# Patient Record
Sex: Female | Born: 1975 | Race: White | Hispanic: No | Marital: Married | Smoking: Never smoker
Health system: Southern US, Community
[De-identification: ages and names within clinical notes are randomized; demographics above are authoritative.]

## PROBLEM LIST (undated history)

## (undated) HISTORY — PX: DILATION AND CURETTAGE OF UTERUS: SHX78

---

## 2005-07-25 ENCOUNTER — Other Ambulatory Visit: Admission: RE | Admit: 2005-07-25 | Discharge: 2005-07-25 | Payer: Self-pay | Admitting: Gynecology

## 2006-07-27 ENCOUNTER — Other Ambulatory Visit: Admission: RE | Admit: 2006-07-27 | Discharge: 2006-07-27 | Payer: Self-pay | Admitting: Gynecology

## 2007-01-04 ENCOUNTER — Ambulatory Visit (HOSPITAL_COMMUNITY): Admission: AD | Admit: 2007-01-04 | Discharge: 2007-01-04 | Payer: Self-pay | Admitting: Obstetrics and Gynecology

## 2007-01-04 ENCOUNTER — Encounter (INDEPENDENT_AMBULATORY_CARE_PROVIDER_SITE_OTHER): Payer: Self-pay | Admitting: Specialist

## 2007-11-15 ENCOUNTER — Inpatient Hospital Stay (HOSPITAL_COMMUNITY): Admission: AD | Admit: 2007-11-15 | Discharge: 2007-11-17 | Payer: Self-pay | Admitting: Radiology

## 2011-03-22 ENCOUNTER — Other Ambulatory Visit: Payer: Self-pay | Admitting: Obstetrics and Gynecology

## 2011-03-24 NOTE — Op Note (Signed)
NAMENYSA, SARIN             ACCOUNT NO.:  [id number]   MEDICAL RECORD NO.:  [id number]          PATIENT TYPE:  AMB   LOCATION:  [facility]                          FACILITY:  [facility]   PHYSICIAN:  Malva Limes, M.D.    DATE OF BIRTH:  [birth year]   DATE OF PROCEDURE:  01/04/2007  DATE OF DISCHARGE:                               OPERATIVE REPORT   PREOPERATIVE DIAGNOSIS:  Incomplete abortion.   POSTOPERATIVE DIAGNOSES:  Incomplete abortion.   PROCEDURE:  Dilation, evacuation.   SURGEON:  Malva Limes, M.D.   ANESTHESIA:  Monitored anesthesia care.   ANTIBIOTICS:  Ancef 1 gram.   DRAINS:  None.   SPECIMENS:  Products of conception sent to pathology.   COMPLICATIONS:  None.   ESTIMATED BLOOD LOSS:  70 mL.   PROCEDURE:  The patient was taken to the operating room where she was  placed in the dorsal supine position.  MAC anesthesia was administered  without complication.  She was then placed in dorsal lithotomy position.  She was prepped with Betadine and draped in the usual fashion for this  procedure.  The patient had an examine under anesthesia which revealed a  10-week size uterus, being retroverted.  There was a minimal amount of  bleeding going on.  Sterile speculum was placed in the vagina.  A single-  tooth tenaculum was applied to the anterior cervical lip.  The cervix  was serially dilated to a 31-French.  An 8 mm suction cannula was placed  into the uterine cavity and products of conception were withdrawn.  Sharp curettage was then performed followed by repeat suction.  The  patient tolerated the procedure well.  She was taken to recovery room in  stable condition.  Instrument and lap counts were correct times one.  The patient's blood type is Rh positive and therefore no RhoGAM is  indicated.  The patient will be discharged to home.  She will be sent  home with Percocet to take p.r.n.  She will follow up in the office in  two weeks.     ______________________________  Malva Limes, M.D.     MA/MEDQ  D:  01/04/2007  T:  01/04/2007  Job:  [id number]

## 2011-07-27 LAB — CBC
HCT: 25 — ABNORMAL LOW
HCT: 31.1 — ABNORMAL LOW
Hemoglobin: 11 — ABNORMAL LOW
Hemoglobin: 7.4 — CL
MCHC: 35
MCHC: 35.5
MCHC: 35.5
MCV: 92.5
MCV: 92.9
Platelets: 138 — ABNORMAL LOW
Platelets: 147 — ABNORMAL LOW
Platelets: 149 — ABNORMAL LOW
RBC: 2.28 — ABNORMAL LOW
RDW: 13.1
RDW: 13.2
RDW: 13.6
WBC: 13.7 — ABNORMAL HIGH
WBC: 18.9 — ABNORMAL HIGH

## 2011-10-10 ENCOUNTER — Other Ambulatory Visit: Payer: Self-pay | Admitting: Obstetrics and Gynecology

## 2011-11-07 NOTE — L&D Delivery Note (Signed)
Delivery Note At 5:52 PM a viable and healthy female was delivered via Vaginal, Spontaneous Delivery (Presentation: Left Occiput Anterior).  APGAR: 8, 9; weight 8 lb 15.7 oz (4074 g).   Placenta status: Intact, Spontaneous.  Cord: 3 vessels   Anesthesia: Epidural  Episiotomy: None Lacerations: 2nd degree Suture Repair: 3.0 vicryl Est. Blood Loss (mL): 250  Mom to postpartum.  Baby to nursery-stable.  Quintavia Rogstad H. 05/31/2012, 7:29 PM

## 2011-11-08 LAB — OB RESULTS CONSOLE RPR: RPR: NONREACTIVE

## 2011-11-08 LAB — OB RESULTS CONSOLE ANTIBODY SCREEN: Antibody Screen: NEGATIVE

## 2011-11-08 LAB — OB RESULTS CONSOLE HEPATITIS B SURFACE ANTIGEN: Hepatitis B Surface Ag: NEGATIVE

## 2011-11-08 LAB — OB RESULTS CONSOLE ABO/RH: RH Type: POSITIVE

## 2012-04-30 LAB — OB RESULTS CONSOLE GBS: GBS: NEGATIVE

## 2012-05-31 ENCOUNTER — Encounter (HOSPITAL_COMMUNITY): Payer: Self-pay | Admitting: *Deleted

## 2012-05-31 ENCOUNTER — Inpatient Hospital Stay (HOSPITAL_COMMUNITY): Payer: 59 | Admitting: Anesthesiology

## 2012-05-31 ENCOUNTER — Encounter (HOSPITAL_COMMUNITY): Payer: Self-pay | Admitting: Anesthesiology

## 2012-05-31 ENCOUNTER — Inpatient Hospital Stay (HOSPITAL_COMMUNITY)
Admission: AD | Admit: 2012-05-31 | Discharge: 2012-06-02 | DRG: 775 | Disposition: A | Discharge status: Home / Self Care | Payer: 59 | Source: Ambulatory Visit | Attending: Obstetrics and Gynecology | Admitting: Obstetrics and Gynecology

## 2012-05-31 DIAGNOSIS — O480 Post-term pregnancy: Principal | ICD-10-CM | POA: Diagnosis present

## 2012-05-31 LAB — CBC
HCT: 33 % — ABNORMAL LOW (ref 36.0–46.0)
Hemoglobin: 11 g/dL — ABNORMAL LOW (ref 12.0–15.0)
MCHC: 33.3 g/dL (ref 30.0–36.0)
RBC: 3.63 MIL/uL — ABNORMAL LOW (ref 3.87–5.11)
WBC: 8 10*3/uL (ref 4.0–10.5)

## 2012-05-31 LAB — TYPE AND SCREEN
ABO/RH(D): A POS
Antibody Screen: NEGATIVE

## 2012-05-31 LAB — ABO/RH: ABO/RH(D): A POS

## 2012-05-31 MED ORDER — LIDOCAINE HCL (PF) 1 % IJ SOLN
30.0000 mL | INTRAMUSCULAR | Status: DC | PRN
  Filled 2012-05-31: qty 30

## 2012-05-31 MED ORDER — ONDANSETRON HCL 4 MG/2ML IJ SOLN: 4.0000 mg | INTRAMUSCULAR | Status: DC | PRN

## 2012-05-31 MED ORDER — BUTORPHANOL TARTRATE 1 MG/ML IJ SOLN: 1.0000 mg | INTRAMUSCULAR | Status: DC | PRN

## 2012-05-31 MED ORDER — LACTATED RINGERS IV SOLN
500.0000 mL | Freq: Once | INTRAVENOUS | Status: AC
  Administered 2012-05-31: 1000 mL via INTRAVENOUS

## 2012-05-31 MED ORDER — WITCH HAZEL-GLYCERIN EX PADS: 1.0000 "application " | MEDICATED_PAD | CUTANEOUS | Status: DC | PRN

## 2012-05-31 MED ORDER — TETANUS-DIPHTH-ACELL PERTUSSIS 5-2.5-18.5 LF-MCG/0.5 IM SUSP
0.5000 mL | Freq: Once | INTRAMUSCULAR | Status: AC
  Administered 2012-06-01: 0.5 mL via INTRAMUSCULAR
  Filled 2012-05-31: qty 0.5

## 2012-05-31 MED ORDER — ONDANSETRON HCL 4 MG/2ML IJ SOLN: 4.0000 mg | Freq: Four times a day (QID) | INTRAMUSCULAR | Status: DC | PRN

## 2012-05-31 MED ORDER — PRENATAL MULTIVITAMIN CH
1.0000 | ORAL_TABLET | Freq: Every day | ORAL | Status: DC
  Administered 2012-05-31 – 2012-06-02 (×3): 1 via ORAL
  Filled 2012-05-31 (×3): qty 1

## 2012-05-31 MED ORDER — FLEET ENEMA 7-19 GM/118ML RE ENEM: 1.0000 | ENEMA | RECTAL | Status: DC | PRN

## 2012-05-31 MED ORDER — TERBUTALINE SULFATE 1 MG/ML IJ SOLN: 0.2500 mg | Freq: Once | INTRAMUSCULAR | Status: DC | PRN

## 2012-05-31 MED ORDER — SIMETHICONE 80 MG PO CHEW: 80.0000 mg | CHEWABLE_TABLET | ORAL | Status: DC | PRN

## 2012-05-31 MED ORDER — ACETAMINOPHEN 325 MG PO TABS: 650.0000 mg | ORAL_TABLET | ORAL | Status: DC | PRN

## 2012-05-31 MED ORDER — PHENYLEPHRINE 40 MCG/ML (10ML) SYRINGE FOR IV PUSH (FOR BLOOD PRESSURE SUPPORT)
80.0000 ug | PREFILLED_SYRINGE | INTRAVENOUS | Status: DC | PRN
  Filled 2012-05-31: qty 5

## 2012-05-31 MED ORDER — EPHEDRINE 5 MG/ML INJ: 10.0000 mg | INTRAVENOUS | Status: DC | PRN

## 2012-05-31 MED ORDER — CITRIC ACID-SODIUM CITRATE 334-500 MG/5ML PO SOLN: 30.0000 mL | ORAL | Status: DC | PRN

## 2012-05-31 MED ORDER — FENTANYL 2.5 MCG/ML BUPIVACAINE 1/10 % EPIDURAL INFUSION (WH - ANES)
14.0000 mL/h | INTRAMUSCULAR | Status: DC
  Administered 2012-05-31 (×2): 14 mL/h via EPIDURAL
  Filled 2012-05-31 (×2): qty 60

## 2012-05-31 MED ORDER — IBUPROFEN 600 MG PO TABS
600.0000 mg | ORAL_TABLET | Freq: Four times a day (QID) | ORAL | Status: DC
  Administered 2012-05-31 – 2012-06-02 (×6): 600 mg via ORAL
  Filled 2012-05-31 (×6): qty 1

## 2012-05-31 MED ORDER — OXYCODONE-ACETAMINOPHEN 5-325 MG PO TABS: 1.0000 | ORAL_TABLET | ORAL | Status: DC | PRN

## 2012-05-31 MED ORDER — DIBUCAINE 1 % RE OINT: 1.0000 "application " | TOPICAL_OINTMENT | RECTAL | Status: DC | PRN

## 2012-05-31 MED ORDER — MISOPROSTOL 200 MCG PO TABS
800.0000 ug | ORAL_TABLET | Freq: Once | ORAL | Status: AC
  Administered 2012-05-31: 800 ug via RECTAL
  Filled 2012-05-31: qty 4

## 2012-05-31 MED ORDER — EPHEDRINE 5 MG/ML INJ
10.0000 mg | INTRAVENOUS | Status: DC | PRN
  Filled 2012-05-31: qty 4

## 2012-05-31 MED ORDER — ZOLPIDEM TARTRATE 5 MG PO TABS: 5.0000 mg | ORAL_TABLET | Freq: Every evening | ORAL | Status: DC | PRN

## 2012-05-31 MED ORDER — SENNOSIDES-DOCUSATE SODIUM 8.6-50 MG PO TABS
2.0000 | ORAL_TABLET | Freq: Every day | ORAL | Status: DC
  Administered 2012-05-31 – 2012-06-01 (×2): 2 via ORAL

## 2012-05-31 MED ORDER — LIDOCAINE HCL (PF) 1 % IJ SOLN
INTRAMUSCULAR | Status: DC | PRN
  Administered 2012-05-31 (×3): 4 mL

## 2012-05-31 MED ORDER — LACTATED RINGERS IV SOLN: 500.0000 mL | INTRAVENOUS | Status: DC | PRN

## 2012-05-31 MED ORDER — DIPHENHYDRAMINE HCL 25 MG PO CAPS: 25.0000 mg | ORAL_CAPSULE | Freq: Four times a day (QID) | ORAL | Status: DC | PRN

## 2012-05-31 MED ORDER — ONDANSETRON HCL 4 MG PO TABS: 4.0000 mg | ORAL_TABLET | ORAL | Status: DC | PRN

## 2012-05-31 MED ORDER — OXYTOCIN BOLUS FROM INFUSION
250.0000 mL | Freq: Once | INTRAVENOUS | Status: DC
  Filled 2012-05-31: qty 500

## 2012-05-31 MED ORDER — LACTATED RINGERS IV SOLN
INTRAVENOUS | Status: DC
  Administered 2012-05-31: 125 mL/h via INTRAVENOUS

## 2012-05-31 MED ORDER — OXYTOCIN 40 UNITS IN LACTATED RINGERS INFUSION - SIMPLE MED: 62.5000 mL/h | Freq: Once | INTRAVENOUS | Status: DC

## 2012-05-31 MED ORDER — IBUPROFEN 600 MG PO TABS: 600.0000 mg | ORAL_TABLET | Freq: Four times a day (QID) | ORAL | Status: DC | PRN

## 2012-05-31 MED ORDER — PHENYLEPHRINE 40 MCG/ML (10ML) SYRINGE FOR IV PUSH (FOR BLOOD PRESSURE SUPPORT): 80.0000 ug | PREFILLED_SYRINGE | INTRAVENOUS | Status: DC | PRN

## 2012-05-31 MED ORDER — BENZOCAINE-MENTHOL 20-0.5 % EX AERO
1.0000 "application " | INHALATION_SPRAY | CUTANEOUS | Status: DC | PRN
  Administered 2012-06-01: 1 via TOPICAL
  Filled 2012-05-31 (×2): qty 56

## 2012-05-31 MED ORDER — LANOLIN HYDROUS EX OINT: TOPICAL_OINTMENT | CUTANEOUS | Status: DC | PRN

## 2012-05-31 MED ORDER — DIPHENHYDRAMINE HCL 50 MG/ML IJ SOLN: 12.5000 mg | INTRAMUSCULAR | Status: DC | PRN

## 2012-05-31 MED ORDER — OXYTOCIN 40 UNITS IN LACTATED RINGERS INFUSION - SIMPLE MED
1.0000 m[IU]/min | INTRAVENOUS | Status: DC
  Administered 2012-05-31: 2 m[IU]/min via INTRAVENOUS
  Filled 2012-05-31: qty 1000

## 2012-05-31 NOTE — Anesthesia Procedure Notes (Signed)
Epidural Patient location during procedure: OB Start time: 05/31/2012 12:06 PM Reason for block: procedure for pain  Staffing Performed by: anesthesiologist   Preanesthetic Checklist Completed: patient identified, site marked, surgical consent, pre-op evaluation, timeout performed, IV checked, risks and benefits discussed and monitors and equipment checked  Epidural Patient position: sitting Prep: site prepped and draped and DuraPrep Patient monitoring: continuous pulse ox and blood pressure Approach: midline Injection technique: LOR air  Needle:  Needle type: Tuohy  Needle gauge: 17 G Needle length: 9 cm Needle insertion depth: 6 cm Catheter type: closed end flexible Catheter size: 19 Gauge Catheter at skin depth: 11 cm Test dose: negative  Assessment Events: blood not aspirated, injection not painful, no injection resistance, negative IV test and no paresthesia  Additional Notes Discussed risk of headache, infection, bleeding, nerve injury and failed or incomplete block.  Patient voices understanding and wishes to proceed.

## 2012-05-31 NOTE — Progress Notes (Signed)
S: Comfortable, feeling occassional tightening O: AFVSS cvx 4/70/-2 FHT 140 reactive toco Q 2-3  AROM copious clear fluid  A/P  1) Cont pit 2) Will get epidural

## 2012-05-31 NOTE — H&P (Signed)
Amy Horne is a 36 y.o. female presenting for IOL for postdates Pt has had an uncomplicated pregnancy up to this point.  She had a posterior complete previa but this was resolved on ultrasound at 28 weeks.  She presents today for a postdates induction of labor History OB History    Grav Para Term Preterm Abortions TAB SAB Ect Mult Living   3 1 1  0 1 0 1 0 0 1     History reviewed. No pertinent past medical history. Past Surgical History  Procedure Date  . Dilation and curettage of uterus    Family History: family history is not on file. Social History:  reports that she has never smoked. She does not have any smokeless tobacco history on file. She reports that she does not drink alcohol or use illicit drugs.   Prenatal Transfer Tool  Maternal Diabetes: No Genetic Screening: Normal Maternal Ultrasounds/Referrals: Normal Fetal Ultrasounds or other Referrals:  None Maternal Substance Abuse:  No Significant Maternal Medications:  None Significant Maternal Lab Results:  None Other Comments:  None  ROSas above  Dilation: 4 Effacement (%): 60 Station: -2 Exam by:: Dr. Tenny Craw Blood pressure 127/70, pulse 77, temperature 98.1 F (36.7 C), temperature source Oral, resp. rate 18, height 5\' 8"  (1.727 m), weight 87.998 kg (194 lb), SpO2 100.00%. Exam Physical Exam  Prenatal labs: ABO, Rh: --/--/A POS, A POS (07/26 1610) Antibody: NEG (07/26 9604) Rubella: Immune (01/02 0000) RPR: Nonreactive (01/02 0000)  HBsAg: Negative (01/02 0000)  HIV: Non-reactive (01/02 0000)  GBS: Negative (06/25 0000)   Assessment/Plan: Admit for IOL Pitocin, AROM when able Epidural on request  Amy Ditter H. 05/31/2012, 12:58 PM

## 2012-05-31 NOTE — Anesthesia Preprocedure Evaluation (Signed)

## 2012-06-01 LAB — RPR: RPR Ser Ql: NONREACTIVE

## 2012-06-01 LAB — CBC
MCH: 30.8 pg (ref 26.0–34.0)
MCV: 90.9 fL (ref 78.0–100.0)
Platelets: 120 10*3/uL — ABNORMAL LOW (ref 150–400)
RBC: 3.08 MIL/uL — ABNORMAL LOW (ref 3.87–5.11)

## 2012-06-01 NOTE — Anesthesia Postprocedure Evaluation (Signed)
  Anesthesia Post-op Note  Patient: Amy Horne  Procedure(s) Performed: * No procedures listed *  Patient Location: Mother/Baby  Anesthesia Type: Epidural  Level of Consciousness: awake, alert  and oriented  Airway and Oxygen Therapy: Patient Spontanous Breathing  Post-op Pain: mild  Post-op Assessment: Patient's Cardiovascular Status Stable, Respiratory Function Stable, Patent Airway, No signs of Nausea or vomiting and Pain level controlled  Post-op Vital Signs: stable  Complications: No apparent anesthesia complications

## 2012-06-02 MED ORDER — DOCUSATE SODIUM 100 MG PO CAPS: 100.0000 mg | ORAL_CAPSULE | Freq: Two times a day (BID) | ORAL | Status: AC

## 2012-06-02 MED ORDER — HYDROCODONE-ACETAMINOPHEN 5-500 MG PO TABS: 1.0000 | ORAL_TABLET | ORAL | Status: AC | PRN

## 2012-06-02 MED ORDER — IBUPROFEN 600 MG PO TABS: 600.0000 mg | ORAL_TABLET | Freq: Four times a day (QID) | ORAL | Status: AC | PRN

## 2012-06-02 NOTE — Discharge Summary (Signed)
Obstetric Discharge Summary Reason for Admission: induction of labor Prenatal Procedures: NST and ultrasound Intrapartum Procedures: spontaneous vaginal delivery Postpartum Procedures: none Complications-Operative and Postpartum: 2nd degree perineal laceration Hemoglobin  Date Value Range Status  06/01/2012 9.5* 12.0 - 15.0 g/dL Final     HCT  Date Value Range Status  06/01/2012 28.0* 36.0 - 46.0 % Final    Physical Exam:  General: alert, cooperative and appears stated age 36: appropriate Uterine Fundus: firm   Discharge Diagnoses: Term Pregnancy-delivered and Post-date pregnancy  Discharge Information: Date: 06/02/2012 Activity: pelvic rest Diet: routine Medications: Ibuprofen, Colace and Vicodin Condition: improved Instructions: refer to practice specific booklet Discharge to: home Follow-up Information    Follow up with Amy Hercules., MD in 4 weeks. (For a postpartum evaluation)    Contact information:   [address] [place] [address] [phone]          Newborn Data: Live born female  Birth Weight: 8 lb 15.7 oz (4074 g) APGAR: 8, 9  Home with mother.  Amy Miyasato H. 06/02/2012, 10:09 AM

## 2013-08-20 ENCOUNTER — Other Ambulatory Visit: Payer: Self-pay | Admitting: Obstetrics and Gynecology

## 2014-08-31 ENCOUNTER — Other Ambulatory Visit: Payer: Self-pay | Admitting: Obstetrics and Gynecology

## 2014-09-01 LAB — CYTOLOGY - PAP

## 2014-09-07 ENCOUNTER — Encounter (HOSPITAL_COMMUNITY): Payer: Self-pay | Admitting: *Deleted

## 2015-07-01 ENCOUNTER — Emergency Department (HOSPITAL_BASED_OUTPATIENT_CLINIC_OR_DEPARTMENT_OTHER)
Admission: EM | Admit: 2015-07-01 | Discharge: 2015-07-01 | Disposition: A | Discharge status: Home / Self Care | Payer: 59 | Attending: Emergency Medicine | Admitting: Emergency Medicine

## 2015-07-01 ENCOUNTER — Encounter (HOSPITAL_BASED_OUTPATIENT_CLINIC_OR_DEPARTMENT_OTHER): Payer: Self-pay | Admitting: *Deleted

## 2015-07-01 ENCOUNTER — Emergency Department (HOSPITAL_BASED_OUTPATIENT_CLINIC_OR_DEPARTMENT_OTHER): Payer: 59

## 2015-07-01 DIAGNOSIS — Y9375 Activity, martial arts: Secondary | ICD-10-CM | POA: Insufficient documentation

## 2015-07-01 DIAGNOSIS — S92352A Displaced fracture of fifth metatarsal bone, left foot, initial encounter for closed fracture: Secondary | ICD-10-CM | POA: Diagnosis not present

## 2015-07-01 DIAGNOSIS — Y9289 Other specified places as the place of occurrence of the external cause: Secondary | ICD-10-CM | POA: Insufficient documentation

## 2015-07-01 DIAGNOSIS — Y998 Other external cause status: Secondary | ICD-10-CM | POA: Insufficient documentation

## 2015-07-01 DIAGNOSIS — Z793 Long term (current) use of hormonal contraceptives: Secondary | ICD-10-CM | POA: Diagnosis not present

## 2015-07-01 DIAGNOSIS — S99922A Unspecified injury of left foot, initial encounter: Secondary | ICD-10-CM | POA: Diagnosis present

## 2015-07-01 DIAGNOSIS — S92302A Fracture of unspecified metatarsal bone(s), left foot, initial encounter for closed fracture: Secondary | ICD-10-CM

## 2015-07-01 DIAGNOSIS — W1839XA Other fall on same level, initial encounter: Secondary | ICD-10-CM | POA: Diagnosis not present

## 2015-07-01 NOTE — ED Notes (Signed)
Short leg posterior splint applied to left leg.  Neurovascular intact after procedure.  Pt demonstrated crutch walking with no difficulty.  Pt. Tolerated well.

## 2015-07-01 NOTE — ED Provider Notes (Addendum)
CSN: [id number]     Arrival date & time 07/01/15  6578 History   First MD Initiated Contact with Patient 07/01/15 [phone]     Chief Complaint  Patient presents with  . Foot Injury    left     (Consider location/radiation/quality/duration/timing/severity/associated sxs/prior Treatment) Patient is a 39 y.o. female presenting with foot injury. The history is provided by the patient.  Foot Injury Location:  Foot Time since incident:  2 days Injury: yes   Mechanism of injury comment:  Patient was in martial arts practice doing a kick when her left foot turned and she fell over. Does that time she's had left lateral foot pain, swelling and bruising Foot location:  L foot Pain details:    Quality:  Aching and throbbing   Radiates to:  Does not radiate   Severity:  Moderate   Onset quality:  Sudden   Timing:  Constant   Progression:  Unchanged Chronicity:  New Prior injury to area:  No Relieved by:  Rest and elevation Worsened by:  Bearing weight Ineffective treatments:  None tried Associated symptoms: swelling   Associated symptoms: no decreased ROM and no numbness     History reviewed. No pertinent past medical history. Past Surgical History  Procedure Laterality Date  . Dilation and curettage of uterus     No family history on file. Social History  Substance Use Topics  . Smoking status: Never Smoker   . Smokeless tobacco: None  . Alcohol Use: No   OB History    Gravida Para Term Preterm AB TAB SAB Ectopic Multiple Living   3 2 2  0 1 0 1 0 0 2     Review of Systems  All other systems reviewed and are negative.     Allergies  Review of patient's allergies indicates no known allergies.  Home Medications   Prior to Admission medications   Medication Sig Start Date End Date Taking? Authorizing Provider  norethindrone-ethinyl estradiol (OVCON-35,BALZIVA,BRIELLYN) 0.4-35 MG-MCG tablet Take 1 tablet by mouth daily.   Yes Historical Provider, MD  Prenatal Vit-Fe  Fumarate-FA (PRENATAL MULTIVITAMIN) TABS Take 1 tablet by mouth daily.    Historical Provider, MD   BP 130/75 mmHg  Pulse 99  Temp(Src) 98.4 F (36.9 C) (Oral)  Resp 20  Ht 5\' 8"  (1.727 m)  Wt 175 lb (79.379 kg)  BMI 26.61 kg/m2  SpO2 100%  LMP 06/17/2015 Physical Exam  Constitutional: She is oriented to person, place, and time. She appears well-developed and well-nourished. No distress.  HENT:  Head: Normocephalic and atraumatic.  Eyes: EOM are normal. Pupils are equal, round, and reactive to light.  Cardiovascular: Normal rate.   Pulmonary/Chest: Effort normal.  Musculoskeletal:       Left ankle: Normal. No head of 5th metatarsal and no proximal fibula tenderness found.       Left foot: There is tenderness, bony tenderness and swelling.       Feet:  Neurological: She is alert and oriented to person, place, and time.  Skin: Skin is warm and dry.  Psychiatric: She has a normal mood and affect. Her behavior is normal.  Nursing note and vitals reviewed.   ED Course  Procedures (including critical care time) Labs Review Labs Reviewed - No data to display  Imaging Review Dg Foot Complete Left  07/01/2015   CLINICAL DATA:  Acute left foot pain and swelling after rolling injury. Initial encounter.  EXAM: LEFT FOOT - COMPLETE 3+ VIEW  COMPARISON:  None.  FINDINGS: Mildly displaced oblique fracture involving the distal fifth metatarsal is noted. This appears to be closed and posttraumatic. No other bony abnormality is noted. Joint spaces are intact. No soft tissue abnormality is noted.  IMPRESSION: Mildly displaced distal fifth metatarsal fracture is noted.   Electronically Signed   By: Lupita Raider, M.D.   On: 07/01/2015 10:08   I have personally reviewed and evaluated these images and lab results as part of my medical decision-making.   EKG Interpretation None      MDM   Final diagnoses:  Fracture of fifth metatarsal bone, left, closed, initial encounter   patient with  an injury to her foot on Tuesday with persistent pain. Imaging shows a mildly displaced distal fifth metatarsal fracture. Neurovascularly intact but significant ecchymosis and swelling on exam. Patient placed in a short leg splint and put on crutches. She will follow-up in one week with ortho for repeat evaluation.  Gwyneth Sprout, MD 07/01/15 1015  Gwyneth Sprout, MD 07/01/15 1017

## 2015-07-01 NOTE — ED Notes (Signed)
Patient transported to X-ray and returned 

## 2015-07-01 NOTE — ED Notes (Signed)
Patient states she was practicing marshal arts two nights ago, when her left foot rolled.  Now has bruising, swelling and pain.  Able to bear some weight.

## 2015-07-06 ENCOUNTER — Ambulatory Visit (INDEPENDENT_AMBULATORY_CARE_PROVIDER_SITE_OTHER): Payer: 59 | Admitting: Family Medicine

## 2015-07-06 VITALS — BP 125/83 | HR 92 | Ht 68.0 in | Wt 175.0 lb

## 2015-07-06 DIAGNOSIS — S92352A Displaced fracture of fifth metatarsal bone, left foot, initial encounter for closed fracture: Secondary | ICD-10-CM | POA: Diagnosis not present

## 2015-07-06 NOTE — Patient Instructions (Signed)
You have an oblique 5th metatarsal fracture. This should heal really well with conservative treatment. Cam boot when up and walking around but do not put weight on this until we see callus on the x-ray at follow-up appointments. Use crutches. Tylenol  1-2 tabs three times a day as first line for pain. Ok to take ibuprofen or aleve in addition to this if needed. Elevate above your heart when possible. Icing 15 minutes at a time 3-4 times a day. Follow up with me in just over a week - we will repeat your x-rays at that time.

## 2015-07-07 ENCOUNTER — Encounter: Payer: Self-pay | Admitting: Family Medicine

## 2015-07-07 DIAGNOSIS — S92352G Displaced fracture of fifth metatarsal bone, left foot, subsequent encounter for fracture with delayed healing: Secondary | ICD-10-CM | POA: Insufficient documentation

## 2015-07-07 NOTE — Assessment & Plan Note (Signed)
oblique orientation.  Should heal well with conservative measures.  Cam walker with no weight bearing until callus formation.  Tylenol, nsaids as needed.  Icing, elevation.  F/u when about 2 weeks out - repeat x-rays.

## 2015-07-07 NOTE — Progress Notes (Signed)
PCP: No PCP Per Patient  Subjective:   HPI: Patient is a 39 y.o. female here for left foot injury.  Patient reports on 8/23 during tae kwon do she accidentally inverted her left ankle. Immediate pain, some swelling and bruising lateral foot. No prior injuries. Unable to weight bear initially. Placed in a splint, using crutches and referred here. Pain level 0/10 currently.  No past medical history on file.  Current Outpatient Prescriptions on File Prior to Visit  Medication Sig Dispense Refill  . norethindrone-ethinyl estradiol (OVCON-35,BALZIVA,BRIELLYN) 0.4-35 MG-MCG tablet Take 1 tablet by mouth daily.    . Prenatal Vit-Fe Fumarate-FA (PRENATAL MULTIVITAMIN) TABS Take 1 tablet by mouth daily.     No current facility-administered medications on file prior to visit.    Past Surgical History  Procedure Laterality Date  . Dilation and curettage of uterus      No Known Allergies  Social History   Social History  . Marital Status: Married    Spouse Name: N/A  . Number of Children: N/A  . Years of Education: N/A   Occupational History  . Not on file.   Social History Main Topics  . Smoking status: Never Smoker   . Smokeless tobacco: Not on file  . Alcohol Use: No  . Drug Use: No  . Sexual Activity: Yes    Birth Control/ Protection: Pill   Other Topics Concern  . Not on file   Social History Narrative    No family history on file.  BP 125/83 mmHg  Pulse 92  Ht  (1.727 m)  Wt 175 lb (79.379 kg)  BMI 26.61 kg/m2  LMP 06/17/2015  Review of Systems: See HPI above.    Objective:  Physical Exam:  Gen: NAD  Left foot/ankle: Mild swelling and bruising lateral foot.  No other deformity. Able to move ankle in all directions. TTP 5th metatarsal.  No other tenderness. Negative ant drawer and talar tilt.   Negative syndesmotic compression. Thompsons test negative. NV intact distally.     Assessment & Plan:  1. Left 5th metatarsal fracture -  oblique orientation.  Should heal well with conservative measures.  Cam walker with no weight bearing until callus formation.  Tylenol, nsaids as needed.  Icing, elevation.  F/u when about 2 weeks out - repeat x-rays.

## 2015-07-16 ENCOUNTER — Ambulatory Visit (INDEPENDENT_AMBULATORY_CARE_PROVIDER_SITE_OTHER): Payer: 59 | Admitting: Family Medicine

## 2015-07-16 ENCOUNTER — Ambulatory Visit (HOSPITAL_BASED_OUTPATIENT_CLINIC_OR_DEPARTMENT_OTHER)
Admission: RE | Admit: 2015-07-16 | Discharge: 2015-07-16 | Disposition: A | Discharge status: Home / Self Care | Payer: 59 | Source: Ambulatory Visit | Attending: Family Medicine | Admitting: Family Medicine

## 2015-07-16 ENCOUNTER — Encounter: Payer: Self-pay | Admitting: Family Medicine

## 2015-07-16 VITALS — BP 130/85 | HR 88 | Ht 68.0 in | Wt 175.0 lb

## 2015-07-16 DIAGNOSIS — X58XXXD Exposure to other specified factors, subsequent encounter: Secondary | ICD-10-CM | POA: Diagnosis not present

## 2015-07-16 DIAGNOSIS — S92352A Displaced fracture of fifth metatarsal bone, left foot, initial encounter for closed fracture: Secondary | ICD-10-CM | POA: Diagnosis not present

## 2015-07-16 DIAGNOSIS — S92352D Displaced fracture of fifth metatarsal bone, left foot, subsequent encounter for fracture with routine healing: Secondary | ICD-10-CM | POA: Insufficient documentation

## 2015-07-16 NOTE — Patient Instructions (Signed)
You have an oblique 5th metatarsal fracture. This should heal really well with conservative treatment. Cam boot when up and walking around but do not put weight on this until we see callus on the x-ray at follow-up appointments. Use crutches. Tylenol  1-2 tabs three times a day as first line for pain. Ok to take ibuprofen or aleve in addition to this if needed. Elevate above your heart when possible. Icing 15 minutes at a time 3-4 times a day. Follow up with me in 2 weeks - we will repeat your x-rays at this visit, hopefully see some callus so you can start putting weight on it.

## 2015-07-20 NOTE — Assessment & Plan Note (Signed)
oblique orientation.  Repeat radiographs show no additional displacement.  Continue with cam walker with no weight bearing until callus formation.  Tylenol, nsaids as needed.  Icing, elevation.  F/u in 2 weeks, repeat x-rays.

## 2015-07-20 NOTE — Progress Notes (Signed)
PCP: CABEZA,[name], MD  Subjective:   HPI: Patient is a 39 y.o. female here for left foot injury.  8/30: Patient reports on 8/23 during tae kwon do she accidentally inverted her left ankle. Immediate pain, some swelling and bruising lateral foot. No prior injuries. Unable to weight bear initially. Placed in a splint, using crutches and referred here. Pain level 0/10 currently.  9/9: Patient reports she is doing wel. Still not putting full weight on foot. Pain level 0/10 currently. Slight swelling and bruising.  No past medical history on file.  Current Outpatient Prescriptions on File Prior to Visit  Medication Sig Dispense Refill  . norethindrone-ethinyl estradiol (OVCON-35,BALZIVA,BRIELLYN) 0.4-35 MG-MCG tablet Take 1 tablet by mouth daily.    . Prenatal Vit-Fe Fumarate-FA (PRENATAL MULTIVITAMIN) TABS Take 1 tablet by mouth daily.     No current facility-administered medications on file prior to visit.    Past Surgical History  Procedure Laterality Date  . Dilation and curettage of uterus      No Known Allergies  Social History   Social History  . Marital Status: Married    Spouse Name: N/A  . Number of Children: N/A  . Years of Education: N/A   Occupational History  . Not on file.   Social History Main Topics  . Smoking status: Never Smoker   . Smokeless tobacco: Not on file  . Alcohol Use: No  . Drug Use: No  . Sexual Activity: Yes    Birth Control/ Protection: Pill   Other Topics Concern  . Not on file   Social History Narrative    No family history on file.  BP 130/85 mmHg  Pulse 88  Ht  (1.727 m)  Wt 175 lb (79.379 kg)  BMI 26.61 kg/m2  LMP 07/15/2015  Review of Systems: See HPI above.    Objective:  Physical Exam:  Gen: NAD  Left foot/ankle: Mild swelling and bruising lateral foot.  No other deformity. Able to move ankle in all directions. Mild TTP 5th metatarsal.  No other tenderness. Negative ant drawer and talar tilt.    Negative syndesmotic compression. Thompsons test negative. NV intact distally.    Assessment & Plan:  1. Left 5th metatarsal fracture - oblique orientation.  Repeat radiographs show no additional displacement.  Continue with cam walker with no weight bearing until callus formation.  Tylenol, nsaids as needed.  Icing, elevation.  F/u in 2 weeks, repeat x-rays.

## 2015-07-30 ENCOUNTER — Ambulatory Visit (HOSPITAL_BASED_OUTPATIENT_CLINIC_OR_DEPARTMENT_OTHER)
Admission: RE | Admit: 2015-07-30 | Discharge: 2015-07-30 | Disposition: A | Discharge status: Home / Self Care | Payer: 59 | Source: Ambulatory Visit | Attending: Family Medicine | Admitting: Family Medicine

## 2015-07-30 ENCOUNTER — Encounter: Payer: Self-pay | Admitting: Family Medicine

## 2015-07-30 ENCOUNTER — Ambulatory Visit (INDEPENDENT_AMBULATORY_CARE_PROVIDER_SITE_OTHER): Payer: 59 | Admitting: Family Medicine

## 2015-07-30 VITALS — BP 134/86 | HR 94 | Ht 68.0 in | Wt 175.0 lb

## 2015-07-30 DIAGNOSIS — S92352D Displaced fracture of fifth metatarsal bone, left foot, subsequent encounter for fracture with routine healing: Secondary | ICD-10-CM | POA: Insufficient documentation

## 2015-07-30 DIAGNOSIS — X58XXXD Exposure to other specified factors, subsequent encounter: Secondary | ICD-10-CM | POA: Diagnosis not present

## 2015-07-30 DIAGNOSIS — S92302D Fracture of unspecified metatarsal bone(s), left foot, subsequent encounter for fracture with routine healing: Secondary | ICD-10-CM | POA: Diagnosis not present

## 2015-07-30 DIAGNOSIS — Y9375 Activity, martial arts: Secondary | ICD-10-CM | POA: Insufficient documentation

## 2015-07-30 NOTE — Patient Instructions (Signed)
You have an oblique 5th metatarsal fracture. This should heal really well with conservative treatment. Cam boot when up and walking around but do not put weight on this until we see callus on the x-ray at follow-up appointments. Use crutches. Tylenol  1-2 tabs three times a day as first line for pain. Ok to take ibuprofen or aleve in addition to this if needed. Elevate above your heart when possible. Icing 15 minutes at a time 3-4 times a day. Follow up with me in 2 weeks - we will repeat your x-rays at this visit, hopefully see some callus so you can start putting weight on it. If you get out to 8 weeks and we still don't see callus I will start letting you test putting full weight on it to see if you have a fibrous non-union (scar tissue healing instead of bony healing)

## 2015-08-03 ENCOUNTER — Telehealth: Payer: Self-pay | Admitting: Family Medicine

## 2015-08-03 NOTE — Telephone Encounter (Signed)
Spoke to patient and gave information about appointment.

## 2015-08-04 NOTE — Assessment & Plan Note (Signed)
oblique orientation.  Radiographs initially read as 7mm displacement.  By ultrasound appeared to only be 3.71mm displaced.  I suspect based on imaging fragment was compared to 4th metatarsal shaft.  CT performed to assess if any callus has yet formed (none by ultrasound or x-rays) and the maximum extent of displacement.  Unfortunately she has 4mm of displacement on CT in dorsal-plantar plane.  We discussed this is at the very upper limits of acceptable range and appears displaced compared to prior radiographs.  We will go ahead with orthopedic surgery referral to discuss probable ORIF.

## 2015-08-04 NOTE — Progress Notes (Signed)
PCP: CABEZA,[name], MD  Subjective:   HPI: Patient is a 39 y.o. female here for left foot injury.  8/30: Patient reports on 8/23 during tae kwon do she accidentally inverted her left ankle. Immediate pain, some swelling and bruising lateral foot. No prior injuries. Unable to weight bear initially. Placed in a splint, using crutches and referred here. Pain level 0/10 currently.  9/9: Patient reports she is doing wel. Still not putting full weight on foot. Pain level 0/10 currently. Slight swelling and bruising.  9/23: Patient reports she has no pain currently. Has been putting some weight on her foot in the boot. Feels a little sticking sensation on outside. Not taking any medicines for pain.  No past medical history on file.  Current Outpatient Prescriptions on File Prior to Visit  Medication Sig Dispense Refill  . norethindrone-ethinyl estradiol (OVCON-35,BALZIVA,BRIELLYN) 0.4-35 MG-MCG tablet Take 1 tablet by mouth daily.    . Prenatal Vit-Fe Fumarate-FA (PRENATAL MULTIVITAMIN) TABS Take 1 tablet by mouth daily.     No current facility-administered medications on file prior to visit.    Past Surgical History  Procedure Laterality Date  . Dilation and curettage of uterus      No Known Allergies  Social History   Social History  . Marital Status: Married    Spouse Name: N/A  . Number of Children: N/A  . Years of Education: N/A   Occupational History  . Not on file.   Social History Main Topics  . Smoking status: Never Smoker   . Smokeless tobacco: Not on file  . Alcohol Use: No  . Drug Use: No  . Sexual Activity: Yes    Birth Control/ Protection: Pill   Other Topics Concern  . Not on file   Social History Narrative    No family history on file.  BP 134/86 mmHg  Pulse 94  Ht  (1.727 m)  Wt 175 lb (79.379 kg)  BMI 26.61 kg/m2  LMP 07/15/2015  Review of Systems: See HPI above.    Objective:  Physical Exam:  Gen: NAD  Left  foot/ankle: Mild swelling, no bruising lateral foot.  No other deformity. Mild TTP 5th metatarsal.  No other tenderness. Negative ant drawer and talar tilt.   Negative syndesmotic compression. Thompsons test negative. NV intact distally.  MSK u/s:  No callus yet seen 5th metatarsal fracture    Assessment & Plan:  1. Left 5th metatarsal fracture - oblique orientation.  Radiographs initially read as 7mm displacement.  By ultrasound appeared to only be 3.33mm displaced.  I suspect based on imaging fragment was compared to 4th metatarsal shaft.  CT performed to assess if any callus has yet formed (none by ultrasound or x-rays) and the maximum extent of displacement.  Unfortunately she has 4mm of displacement on CT in dorsal-plantar plane.  We discussed this is at the very upper limits of acceptable range and appears displaced compared to prior radiographs.  We will go ahead with orthopedic surgery referral to discuss probable ORIF.

## 2015-09-15 ENCOUNTER — Other Ambulatory Visit: Payer: Self-pay | Admitting: Obstetrics and Gynecology

## 2015-09-16 LAB — CYTOLOGY - PAP

## 2016-05-23 IMAGING — DX DG FOOT COMPLETE 3+V*L*
3 series · 3 of 3 positions shown · non-contrast
Comparison: 07/01/2015 .

CLINICAL DATA: Injury.  Fracture.  Follow-up evaluation.

EXAM:
LEFT FOOT - COMPLETE 3+ VIEW

[foot ap]
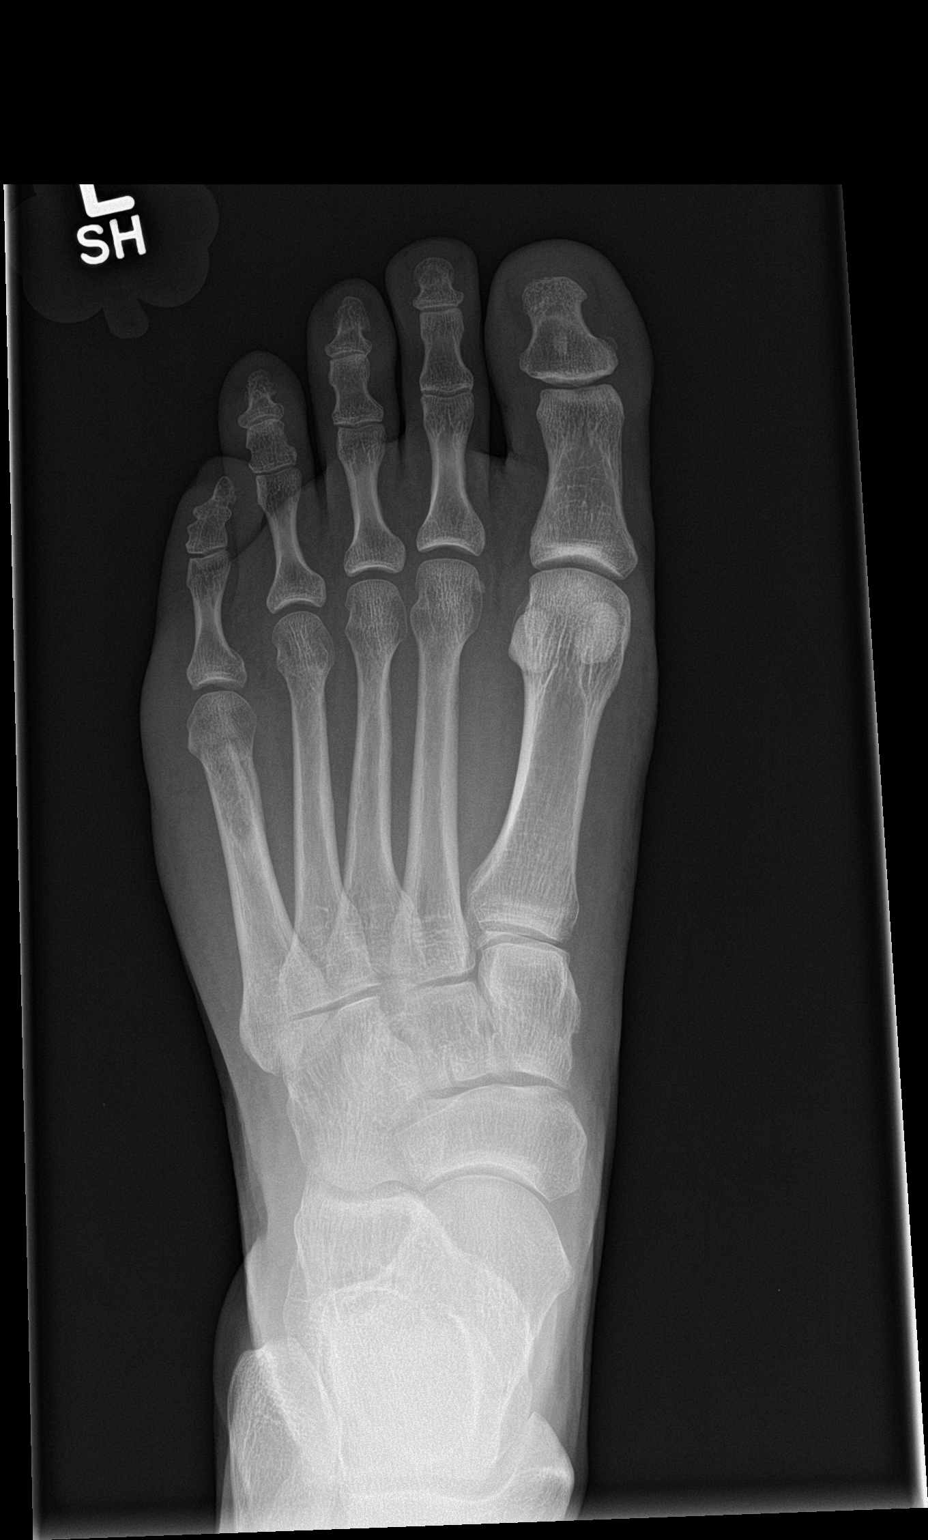

[foot obl]
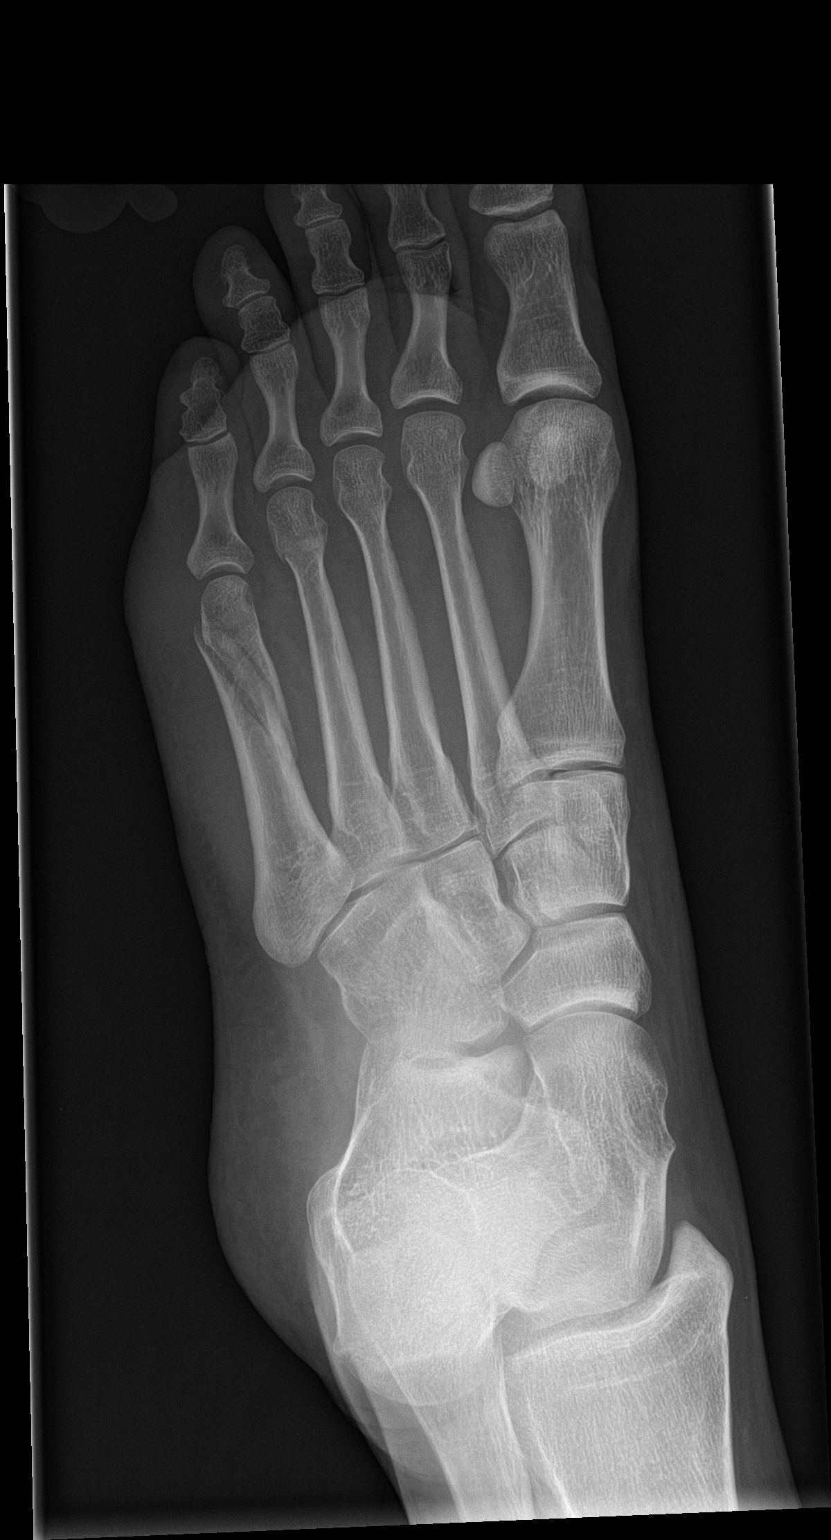

[foot lat]
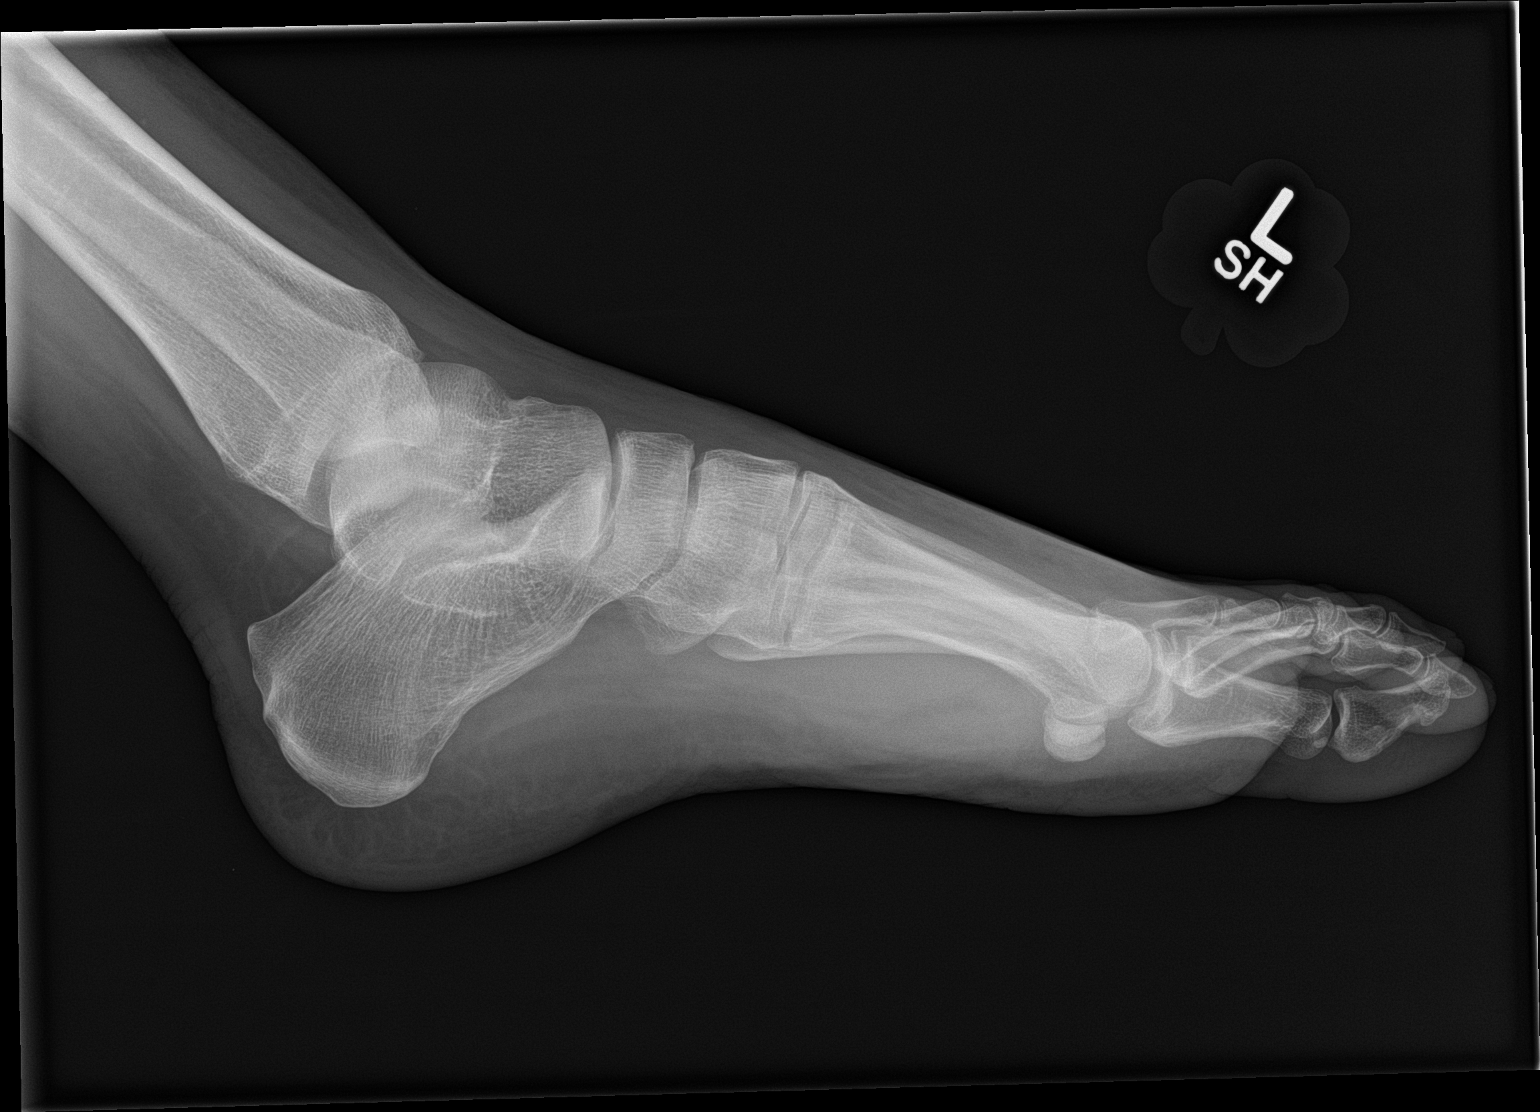

[3 of 3 positions shown; findings below may reference images not displayed]

FINDINGS: An oblique slightly displaced fracture of the left fifth metatarsal
is noted. No prominent callus formation noted at this time. No other
focal abnormality identified.
IMPRESSION: Slight displaced oblique fracture of the left fifth metatarsal, no
change from prior exam .

## 2016-09-22 ENCOUNTER — Other Ambulatory Visit: Payer: Self-pay | Admitting: Obstetrics and Gynecology

## 2016-09-25 LAB — CYTOLOGY - PAP

## 2023-03-22 ENCOUNTER — Other Ambulatory Visit: Payer: Self-pay | Admitting: Obstetrics and Gynecology

## 2023-03-22 DIAGNOSIS — R928 Other abnormal and inconclusive findings on diagnostic imaging of breast: Secondary | ICD-10-CM

## 2023-04-03 ENCOUNTER — Other Ambulatory Visit: Payer: 59

## 2023-04-12 ENCOUNTER — Ambulatory Visit
Admission: RE | Admit: 2023-04-12 | Discharge: 2023-04-12 | Disposition: A | Discharge status: Home / Self Care | Payer: 59 | Source: Ambulatory Visit | Attending: Obstetrics and Gynecology | Admitting: Obstetrics and Gynecology

## 2023-04-12 ENCOUNTER — Ambulatory Visit
Admission: RE | Admit: 2023-04-12 | Discharge: 2023-04-12 | Disposition: A | Discharge status: Home / Self Care | Payer: Self-pay | Source: Ambulatory Visit | Attending: Obstetrics and Gynecology | Admitting: Obstetrics and Gynecology

## 2023-04-12 DIAGNOSIS — R928 Other abnormal and inconclusive findings on diagnostic imaging of breast: Secondary | ICD-10-CM
